# Patient Record
Sex: Male | Born: 1960 | Race: Black or African American | Hispanic: No | Marital: Single | Smoking: Never smoker
Health system: Southern US, Community
[De-identification: ages and names within clinical notes are randomized; demographics above are authoritative.]

## PROBLEM LIST (undated history)

## (undated) DIAGNOSIS — G8929 Other chronic pain: Secondary | ICD-10-CM

## (undated) HISTORY — PX: HERNIA REPAIR: SHX51

---

## 2008-11-03 DIAGNOSIS — Z86010 Personal history of colonic polyps: Secondary | ICD-10-CM | POA: Insufficient documentation

## 2018-11-09 ENCOUNTER — Encounter: Payer: Self-pay | Admitting: Emergency Medicine

## 2018-11-09 ENCOUNTER — Other Ambulatory Visit: Payer: Self-pay

## 2018-11-09 ENCOUNTER — Emergency Department
Admission: EM | Admit: 2018-11-09 | Discharge: 2018-11-09 | Disposition: A | Discharge status: Home / Self Care | Payer: Medicare Other | Attending: Emergency Medicine | Admitting: Emergency Medicine

## 2018-11-09 DIAGNOSIS — M436 Torticollis: Secondary | ICD-10-CM | POA: Diagnosis not present

## 2018-11-09 DIAGNOSIS — M542 Cervicalgia: Secondary | ICD-10-CM | POA: Diagnosis present

## 2018-11-09 MED ORDER — METHOCARBAMOL 500 MG PO TABS: 500.0000 mg | ORAL_TABLET | Freq: Four times a day (QID) | ORAL | 0 refills | Status: DC | PRN

## 2018-11-09 MED ORDER — KETOROLAC TROMETHAMINE 30 MG/ML IJ SOLN
30.0000 mg | Freq: Once | INTRAMUSCULAR | Status: AC
  Administered 2018-11-09: 30 mg via INTRAMUSCULAR
  Filled 2018-11-09: qty 1

## 2018-11-09 MED ORDER — KETOROLAC TROMETHAMINE 10 MG PO TABS: 10.0000 mg | ORAL_TABLET | Freq: Four times a day (QID) | ORAL | 0 refills | Status: DC | PRN

## 2018-11-09 NOTE — ED Notes (Signed)
Pt c/o neck pain xfew days, states " krik in my neck". NAD noted

## 2018-11-09 NOTE — ED Provider Notes (Signed)
[facility] Emergency Department Provider Note  ____________________________________________   First MD Initiated Contact with Patient 11/09/18 1212     (approximate)  I have reviewed the triage vital signs and the nursing notes.   HISTORY  Chief Complaint Torticollis   HPI Rose PhiShelton Wadhwa is a 58 y.o. male presents to the ED with complaint of cervical pain and upper back pain after he woke up approximately 1 week ago.  Patient states that he is occasionally taken over-the-counter medication without any relief.  He denies any injury to his neck.  He denies any paresthesias to his upper or lower extremities.  Patient continues to ambulate without any assistance.   History reviewed. No pertinent past medical history.  There are no active problems to display for this patient.   History reviewed. No pertinent surgical history.  Prior to Admission medications   Medication Sig Start Date End Date Taking? Authorizing Provider  ketorolac (TORADOL) 10 MG tablet Take 1 tablet (10 mg total) by mouth every 6 (six) hours as needed for moderate pain. 11/09/18   Tommi RumpsSummers, Gleen Ripberger L, PA-C  methocarbamol (ROBAXIN) 500 MG tablet Take 1 tablet (500 mg total) by mouth every 6 (six) hours as needed. 11/09/18   Tommi RumpsSummers, Krista Godsil L, PA-C    Allergies Patient has no known allergies.  No family history on file.  Social History Social History   Tobacco Use  . Smoking status: Never Smoker  . Smokeless tobacco: Never Used  Substance Use Topics  . Alcohol use: Not on file  . Drug use: Not on file    Review of Systems Constitutional: No fever/chills Eyes: No visual changes. ENT: No sore throat. Cardiovascular: Denies chest pain. Respiratory: Denies shortness of breath. Gastrointestinal: No abdominal pain.  No nausea, no vomiting.  Musculoskeletal: Positive for cervical pain and upper back pain. Skin: Negative for rash. Neurological: Negative for headaches, focal weakness  or numbness. ___________________________________________   PHYSICAL EXAM:  VITAL SIGNS: ED Triage Vitals [11/09/18 1153]  Enc Vitals Group     BP 127/77     Pulse Rate 65     Resp 16     Temp 97.7 F (36.5 C)     Temp Source Oral     SpO2 98 %     Weight      Height      Head Circumference      Peak Flow      Pain Score      Pain Loc      Pain Edu?      Excl. in GC?    Constitutional: Alert and oriented. Well appearing and in no acute distress. Eyes: Conjunctivae are normal.  Head: Atraumatic. Nose: No congestion/rhinnorhea. Mouth/Throat: Mucous membranes are moist.  Oropharynx non-erythematous. Neck: No stridor.  No tenderness on palpation of cervical spine posteriorly.  Range of motion is decreased with increased pain with lateral movement and extension.  There is tenderness on palpation of the trapezius muscles as well.  No soft tissue edema or discoloration to suggest injury. Cardiovascular: Normal rate, regular rhythm. Grossly normal heart sounds.  Good peripheral circulation. Respiratory: Normal respiratory effort.  No retractions. Lungs CTAB. Gastrointestinal: Soft and nontender. No distention. Musculoskeletal: Moves upper and lower extremities without any difficulty.  Normal gait was noted. Neurologic:  Normal speech and language. No gross focal neurologic deficits are appreciated. No gait instability. Skin:  Skin is warm, dry and intact. No rash noted. Psychiatric: Mood and affect are normal. Speech and behavior  are normal.  ____________________________________________   LABS (all labs ordered are listed, but only abnormal results are displayed)  Labs Reviewed - No data to display  PROCEDURES  Procedure(s) performed: None  Procedures  Critical Care performed: No  ____________________________________________   INITIAL IMPRESSION / ASSESSMENT AND PLAN / ED COURSE  As part of my medical decision making, I reviewed the following data within the  electronic MEDICAL RECORD NUMBER Notes from prior ED visits and [place] Controlled Substance Database  Patient presents to the ED with complaint of cervical pain after waking up approximately 1 week ago.  Patient states that there is been no injury to his neck.  He denies any paresthesias to his upper extremities.  He is tried over-the-counter medications without any improvement.  He denies any fever or chills.  Physical exam is consistent with a torticollis.  Patient was given tramadol 30 mg IM while in the department however he is driving and muscle relaxants were not used.  He was discharged with a prescription for methocarbamol 500 mg every 6 hours as needed for muscle spasms and to continue with Toradol 10 mg every 6 hours with food.  He is encouraged to use ice or heat to his neck as needed for discomfort.  ____________________________________________   FINAL CLINICAL IMPRESSION(S) / ED DIAGNOSES  Final diagnoses:  Torticollis, acute     ED Discharge Orders         Ordered    methocarbamol (ROBAXIN) 500 MG tablet  Every 6 hours PRN     11/09/18 1327    ketorolac (TORADOL) 10 MG tablet  Every 6 hours PRN     11/09/18 1327           Note:  This document was prepared using Dragon voice recognition software and may include unintentional dictation errors.    Tommi Rumps, PA-C 11/09/18 1416    Rockne Menghini, MD 11/10/18 1537

## 2018-11-09 NOTE — Discharge Instructions (Signed)
Follow-up with your primary care provider or can no clinic acute care if any continued problems.  Begin taking the medications only as directed.  Robaxin is 1 tablet every 6 hours as needed for muscle spasms.  This medication could cause drowsiness and do not take this and drive.  Toradol 10 mg every 6 hours with food for inflammation and pain.  You may USE ice or heat to your neck and back as needed for discomfort.

## 2018-11-09 NOTE — ED Triage Notes (Signed)
Pt c/o neck and back pain

## 2018-11-09 NOTE — ED Notes (Signed)
See triage note Presents with stiffness to left aside of neck for couple of days  States he woke up with sx's  Denies any injury

## 2019-02-16 ENCOUNTER — Other Ambulatory Visit: Payer: Self-pay

## 2019-02-16 ENCOUNTER — Emergency Department
Admission: EM | Admit: 2019-02-16 | Discharge: 2019-02-16 | Disposition: A | Discharge status: Home / Self Care | Payer: Medicare Other | Attending: Emergency Medicine | Admitting: Emergency Medicine

## 2019-02-16 ENCOUNTER — Encounter: Payer: Self-pay | Admitting: Emergency Medicine

## 2019-02-16 ENCOUNTER — Emergency Department: Payer: Medicare Other

## 2019-02-16 DIAGNOSIS — R059 Cough, unspecified: Secondary | ICD-10-CM

## 2019-02-16 DIAGNOSIS — R05 Cough: Secondary | ICD-10-CM | POA: Diagnosis not present

## 2019-02-16 LAB — CBC WITH DIFFERENTIAL/PLATELET
Abs Immature Granulocytes: 0.02 10*3/uL (ref 0.00–0.07)
Basophils Absolute: 0 10*3/uL (ref 0.0–0.1)
Basophils Relative: 1 %
Eosinophils Absolute: 0 10*3/uL (ref 0.0–0.5)
Eosinophils Relative: 0 %
HCT: 40.5 % (ref 39.0–52.0)
Hemoglobin: 14.4 g/dL (ref 13.0–17.0)
Immature Granulocytes: 0 %
Lymphocytes Relative: 16 %
Lymphs Abs: 1.3 10*3/uL (ref 0.7–4.0)
MCH: 32.3 pg (ref 26.0–34.0)
MCHC: 35.6 g/dL (ref 30.0–36.0)
MCV: 90.8 fL (ref 80.0–100.0)
Monocytes Absolute: 0.5 10*3/uL (ref 0.1–1.0)
Monocytes Relative: 6 %
Neutro Abs: 6 10*3/uL (ref 1.7–7.7)
Neutrophils Relative %: 77 %
Platelets: 254 10*3/uL (ref 150–400)
RBC: 4.46 MIL/uL (ref 4.22–5.81)
RDW: 12.1 % (ref 11.5–15.5)
Smear Review: NORMAL
WBC: 7.8 10*3/uL (ref 4.0–10.5)
nRBC: 0 % (ref 0.0–0.2)

## 2019-02-16 LAB — BASIC METABOLIC PANEL
Anion gap: 9 (ref 5–15)
BUN: 12 mg/dL (ref 6–20)
CO2: 27 mmol/L (ref 22–32)
Calcium: 10 mg/dL (ref 8.9–10.3)
Chloride: 104 mmol/L (ref 98–111)
Creatinine, Ser: 1.12 mg/dL (ref 0.61–1.24)
GFR calc Af Amer: >60 mL/min (ref >60)
GFR calc non Af Amer: >60 mL/min (ref >60)
Glucose, Bld: 94 mg/dL (ref 70–99)
Potassium: 4.4 mmol/L (ref 3.5–5.1)
Sodium: 140 mmol/L (ref 135–145)

## 2019-02-16 LAB — BRAIN NATRIURETIC PEPTIDE: B Natriuretic Peptide: 13 pg/mL (ref 0.0–100.0)

## 2019-02-16 LAB — TROPONIN I: Troponin I: <0.03 ng/mL (ref <0.03)

## 2019-02-16 MED ORDER — BENZONATATE 200 MG PO CAPS: 200.0000 mg | ORAL_CAPSULE | Freq: Three times a day (TID) | ORAL | 0 refills | Status: DC | PRN

## 2019-02-16 NOTE — ED Provider Notes (Signed)
[facility] Emergency Department Provider Note       Time seen: ----------------------------------------- 12:45 PM on 02/16/2019 -----------------------------------------   I have reviewed the triage vital signs and the nursing notes.  HISTORY   Chief Complaint Cough    HPI Ryan Erickson is a 58 y.o. male with no known past medical history who presents to the ED for persistent dry cough for the past 3 weeks.  Patient denies any associated symptoms and arrives afebrile.  Patient denies fevers, chills, chest pain, shortness of breath, vomiting or diarrhea.  He denies any sick contacts.  History reviewed. No pertinent past medical history.  There are no active problems to display for this patient.   History reviewed. No pertinent surgical history.  Allergies Patient has no known allergies.  Social History Social History   Tobacco Use  . Smoking status: Never Smoker  . Smokeless tobacco: Never Used  Substance Use Topics  . Alcohol use: Never    Frequency: Never  . Drug use: Yes    Types: Marijuana   Review of Systems Constitutional: Negative for fever. Cardiovascular: Negative for chest pain. Respiratory: Positive for cough Gastrointestinal: Negative for abdominal pain, vomiting and diarrhea. Musculoskeletal: Negative for back pain. Skin: Negative for rash. Neurological: Negative for headaches, focal weakness or numbness.  All systems negative/normal/unremarkable except as stated in the HPI  ____________________________________________   PHYSICAL EXAM:  VITAL SIGNS: ED Triage Vitals [02/16/19 1200]  Enc Vitals Group     BP (!) 152/99     Pulse Rate (!) 117     Resp 16     Temp 98.4 F (36.9 C)     Temp Source Oral     SpO2 96 %     Weight 137 lb (62.1 kg)     Height 5\' 5"  (1.651 m)     Head Circumference      Peak Flow      Pain Score 0     Pain Loc      Pain Edu?      Excl. in GC?    Constitutional: Alert and oriented.  Well appearing and in no distress. Eyes: Conjunctivae are normal. Normal extraocular movements. ENT      Head: Normocephalic and atraumatic.      Nose: No congestion/rhinnorhea.      Mouth/Throat: Mucous membranes are moist.      Neck: No stridor. Cardiovascular: Normal rate, regular rhythm. No murmurs, rubs, or gallops. Respiratory: Normal respiratory effort without tachypnea nor retractions. Breath sounds are clear and equal bilaterally. No wheezes/rales/rhonchi. Gastrointestinal: Soft and nontender. Normal bowel sounds Musculoskeletal: Nontender with normal range of motion in extremities. No lower extremity tenderness nor edema. Neurologic:  Normal speech and language. No gross focal neurologic deficits are appreciated.  Skin:  Skin is warm, dry and intact. No rash noted. Psychiatric: Mood and affect are normal. Speech and behavior are normal.  ___________________________________________  ED COURSE:  As part of my medical decision making, I reviewed the following data within the electronic MEDICAL RECORD NUMBER History obtained from family if available, nursing notes, old chart and ekg, as well as notes from prior ED visits. Patient presented for cough, we will assess with labs and imaging as indicated at this time.   Procedures  Ryan PhiShelton Legrand was evaluated in Emergency Department on 02/16/2019 for the symptoms described in the history of present illness. He was evaluated in the context of the global COVID-19 pandemic, which necessitated consideration that the patient might be at  risk for infection with the SARS-CoV-2 virus that causes COVID-19. Institutional protocols and algorithms that pertain to the evaluation of patients at risk for COVID-19 are in a state of rapid change based on information released by regulatory bodies including the CDC and federal and state organizations. These policies and algorithms were followed during the patient's care in the ED.   EKG: Sinus rhythm the rate of 84  bpm, normal PR interval, normal QRS, normal QT ____________________________________________   LABS (pertinent positives/negatives)  Labs Reviewed  CBC WITH DIFFERENTIAL/PLATELET  BASIC METABOLIC PANEL  BRAIN NATRIURETIC PEPTIDE  TROPONIN I    RADIOLOGY Images were viewed by me  Chest x-ray IMPRESSION: No active disease.  ____________________________________________   DIFFERENTIAL DIAGNOSIS   Cough, viral syndrome, pneumonia, GERD, medication side effect, anxiety  FINAL ASSESSMENT AND PLAN  Cough   Plan: The patient had presented for persistent cough. Patient's labs were reassuring. Patient's imaging is unremarkable.  No clear etiology for his cough.  I will prescribe Tessalon to take as needed.   Ulice Dash, MD    Note: This note was generated in part or whole with voice recognition software. Voice recognition is usually quite accurate but there are transcription errors that can and very often do occur. I apologize for any typographical errors that were not detected and corrected.     Emily Filbert, MD 02/16/19 [phone]

## 2019-02-16 NOTE — ED Triage Notes (Signed)
Pt in via POV, reports dry cough x 3 weeks, denies any associated symptoms.  Pt afebrile.  NAD noted at this time.

## 2020-03-26 DIAGNOSIS — E559 Vitamin D deficiency, unspecified: Secondary | ICD-10-CM | POA: Insufficient documentation

## 2020-07-28 ENCOUNTER — Encounter: Payer: Self-pay | Admitting: Emergency Medicine

## 2020-07-28 ENCOUNTER — Other Ambulatory Visit: Payer: Self-pay

## 2020-07-28 ENCOUNTER — Emergency Department
Admission: EM | Admit: 2020-07-28 | Discharge: 2020-07-28 | Disposition: A | Discharge status: Home / Self Care | Payer: Medicare Other | Attending: Emergency Medicine | Admitting: Emergency Medicine

## 2020-07-28 ENCOUNTER — Emergency Department: Payer: Medicare Other

## 2020-07-28 DIAGNOSIS — R42 Dizziness and giddiness: Secondary | ICD-10-CM | POA: Diagnosis present

## 2020-07-28 DIAGNOSIS — R079 Chest pain, unspecified: Secondary | ICD-10-CM | POA: Insufficient documentation

## 2020-07-28 LAB — DIFFERENTIAL
Abs Immature Granulocytes: 0.04 K/uL (ref 0.00–0.07)
Basophils Absolute: 0 K/uL (ref 0.0–0.1)
Basophils Relative: 0 %
Eosinophils Absolute: 0.1 K/uL (ref 0.0–0.5)
Eosinophils Relative: 1 %
Immature Granulocytes: 0 %
Lymphocytes Relative: 27 %
Lymphs Abs: 2.5 K/uL (ref 0.7–4.0)
Monocytes Absolute: 0.5 K/uL (ref 0.1–1.0)
Monocytes Relative: 6 %
Neutro Abs: 6.1 K/uL (ref 1.7–7.7)
Neutrophils Relative %: 66 %

## 2020-07-28 LAB — PROTIME-INR
INR: 0.9 (ref 0.8–1.2)
Prothrombin Time: 12.1 seconds (ref 11.4–15.2)

## 2020-07-28 LAB — APTT: aPTT: 27 seconds (ref 24–36)

## 2020-07-28 LAB — COMPREHENSIVE METABOLIC PANEL WITH GFR
ALT: 42 U/L (ref 0–44)
AST: 37 U/L (ref 15–41)
Albumin: 4.5 g/dL (ref 3.5–5.0)
Alkaline Phosphatase: 59 U/L (ref 38–126)
Anion gap: 10 (ref 5–15)
BUN: 15 mg/dL (ref 8–23)
CO2: 24 mmol/L (ref 22–32)
Calcium: 9.6 mg/dL (ref 8.9–10.3)
Chloride: 107 mmol/L (ref 98–111)
Creatinine, Ser: 1.21 mg/dL (ref 0.61–1.24)
GFR calc Af Amer: >60 mL/min
GFR calc non Af Amer: >60 mL/min
Glucose, Bld: 169 mg/dL — ABNORMAL HIGH (ref 70–99)
Potassium: 3.6 mmol/L (ref 3.5–5.1)
Sodium: 141 mmol/L (ref 135–145)
Total Bilirubin: 1.1 mg/dL (ref 0.3–1.2)
Total Protein: 7.9 g/dL (ref 6.5–8.1)

## 2020-07-28 LAB — GLUCOSE, CAPILLARY: Glucose-Capillary: 169 mg/dL — ABNORMAL HIGH (ref 70–99)

## 2020-07-28 LAB — CBC
HCT: 37.6 % — ABNORMAL LOW (ref 39.0–52.0)
Hemoglobin: 13.3 g/dL (ref 13.0–17.0)
MCH: 33.1 pg (ref 26.0–34.0)
MCHC: 35.4 g/dL (ref 30.0–36.0)
MCV: 93.5 fL (ref 80.0–100.0)
Platelets: 277 10*3/uL (ref 150–400)
RBC: 4.02 MIL/uL — ABNORMAL LOW (ref 4.22–5.81)
RDW: 12.4 % (ref 11.5–15.5)
WBC: 9.3 10*3/uL (ref 4.0–10.5)
nRBC: 0 % (ref 0.0–0.2)

## 2020-07-28 LAB — TROPONIN I (HIGH SENSITIVITY): Troponin I (High Sensitivity): 6 ng/L (ref <18)

## 2020-07-28 MED ORDER — ASPIRIN EC 81 MG PO TBEC: 81.0000 mg | DELAYED_RELEASE_TABLET | Freq: Every day | ORAL | 2 refills | Status: AC

## 2020-07-28 MED ORDER — MECLIZINE HCL 12.5 MG PO TABS: 12.5000 mg | ORAL_TABLET | Freq: Three times a day (TID) | ORAL | 0 refills | Status: DC | PRN

## 2020-07-28 MED ORDER — ASPIRIN 81 MG PO CHEW
324.0000 mg | CHEWABLE_TABLET | Freq: Once | ORAL | Status: AC
  Administered 2020-07-28: 324 mg via ORAL
  Filled 2020-07-28: qty 4

## 2020-07-28 NOTE — ED Triage Notes (Addendum)
First RN note: pt presents to ED via [facility] with c/o nausea, dizziness, and 1 episode of emesis this morning PTA.    160/84 71HR 100% RA

## 2020-07-28 NOTE — ED Notes (Signed)
Pt reports L-sided pressure for about a month now. States it is a 2/10 currently.

## 2020-07-28 NOTE — ED Notes (Signed)
Called lab about adding on troponin. [name] states they're running it now. Pt denies history of taking BP meds or known HTN. Denies history of HA's. Pt given another warm blanket.

## 2020-07-28 NOTE — ED Notes (Signed)
Pt given food tray and water by Joni Reining NT with verbal okay from EDP [place].

## 2020-07-28 NOTE — ED Triage Notes (Signed)
Pt to ED via [facility] c/o dizziness and vomiting. Pt states that this started when he woke up. Pt feels like the room is spinning. Pt denies hx/o vertigo. Pt is in NAD.

## 2020-07-28 NOTE — ED Notes (Signed)
Pt on the phone updating his family/friends.

## 2020-07-28 NOTE — ED Provider Notes (Signed)
[facility] Emergency Department Provider Note   ____________________________________________   First MD Initiated Contact with Patient 07/28/20 1507     (approximate)  I have reviewed the triage vital signs and the nursing notes.   HISTORY  Chief Complaint Dizziness    HPI Ryan Erickson is a 59 y.o. male with no significant past medical history who presents to the ED complaining of dizziness.  Patient reports that when he woke up this morning he felt like the room was spinning around him and symptoms got worse when he went to stand up.  He initially had some nausea while feeling dizzy, but did not vomit.  He denies any associated vision changes, speech changes, numbness, or weakness.  He was feeling fine when he went to bed last night and denies any recent fevers, cough, or shortness of breath.  He does state he has been having intermittent sharp pain in his left upper chest for about the past month.  This pain is worse when he lays in certain positions, but he currently denies any chest pain.  He states that his dizziness is also significantly improved and he is no longer feeling nauseous.  He denies any history of vertigo or similar symptoms.        History reviewed. No pertinent past medical history.  There are no problems to display for this patient.   History reviewed. No pertinent surgical history.  Prior to Admission medications   Medication Sig Start Date End Date Taking? Authorizing Provider  aspirin EC 81 MG tablet Take 1 tablet (81 mg total) by mouth daily. Swallow whole. 07/28/20 10/21/21  Chesley Noon, MD  meclizine (ANTIVERT) 12.5 MG tablet Take 1 tablet (12.5 mg total) by mouth 3 (three) times daily as needed for dizziness. 07/28/20   Chesley Noon, MD    Allergies Patient has no known allergies.  No family history on file.  Social History Social History   Tobacco Use  . Smoking status: Never Smoker  . Smokeless tobacco:  Never Used  Vaping Use  . Vaping Use: Never used  Substance Use Topics  . Alcohol use: Never  . Drug use: Yes    Types: Marijuana    Review of Systems  Constitutional: No fever/chills. Eyes: No visual changes. ENT: No sore throat. Cardiovascular: Positive for chest pain. Respiratory: Denies shortness of breath. Gastrointestinal: No abdominal pain.  No nausea, no vomiting.  No diarrhea.  No constipation. Genitourinary: Negative for dysuria. Musculoskeletal: Negative for back pain. Skin: Negative for rash. Neurological: Negative for headaches, focal weakness or numbness.  Positive for dizziness.  ____________________________________________   PHYSICAL EXAM:  VITAL SIGNS: ED Triage Vitals  Enc Vitals Group     BP 07/28/20 0903 (!) 177/88     Pulse Rate 07/28/20 0903 61     Resp 07/28/20 0903 12     Temp --      Temp Source 07/28/20 0903 Oral     SpO2 07/28/20 0903 100 %     Weight --      Height --      Head Circumference --      Peak Flow --      Pain Score 07/28/20 0904 0     Pain Loc --      Pain Edu? --      Excl. in GC? --     Constitutional: Alert and oriented. Eyes: Conjunctivae are normal.  Pupils equal round and reactive to light bilaterally, extraocular movements intact. Head:  Atraumatic. Nose: No congestion/rhinnorhea. Mouth/Throat: Mucous membranes are moist. Neck: Normal ROM Cardiovascular: Normal rate, regular rhythm. Grossly normal heart sounds. Respiratory: Normal respiratory effort.  No retractions. Lungs CTAB. Gastrointestinal: Soft and nontender. No distention. Genitourinary: deferred Musculoskeletal: No lower extremity tenderness nor edema. Neurologic:  Normal speech and language. No gross focal neurologic deficits are appreciated.  Ambulates with steady gait. Skin:  Skin is warm, dry and intact. No rash noted. Psychiatric: Mood and affect are normal. Speech and behavior are normal.  ____________________________________________    LABS (all labs ordered are listed, but only abnormal results are displayed)  Labs Reviewed  CBC - Abnormal; Notable for the following components:      Result Value   RBC 4.02 (*)    HCT 37.6 (*)    All other components within normal limits  COMPREHENSIVE METABOLIC PANEL - Abnormal; Notable for the following components:   Glucose, Bld 169 (*)    All other components within normal limits  GLUCOSE, CAPILLARY - Abnormal; Notable for the following components:   Glucose-Capillary 169 (*)    All other components within normal limits  PROTIME-INR  APTT  DIFFERENTIAL  CBG MONITORING, ED  TROPONIN I (HIGH SENSITIVITY)   ____________________________________________  EKG  ED ECG REPORT I, Chesley Noon, the attending physician, personally viewed and interpreted this ECG.   Date: 07/28/2020  EKG Time: 9:16  Rate: 65  Rhythm: normal sinus rhythm  Axis: Normal  Intervals:none  ST&T Change: None   PROCEDURES  Procedure(s) performed (including Critical Care):  Procedures   ____________________________________________   INITIAL IMPRESSION / ASSESSMENT AND PLAN / ED COURSE       59 year old male with no significant past medical history presents to the ED with acute onset dizziness and feeling like the room is spinning around him after getting up this morning.  He also reports intermittent sharp pain in his left upper chest over the past month.  Chest pain is likely musculoskeletal in origin given it is worse with certain positions and has been intermittent for greater than 1 month.  EKG shows no acute ischemic changes, will screen troponin but low suspicion for ACS.  Remainder of lab work is reassuring.  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  Plan to discuss with neurology and potentially obtain MRI here in the ED.  Patient now states that  his symptoms are improving and he is able to ambulate with a steady gait, so I have a low suspicion for acute stroke.  CT findings discussed with neurology, who agrees that there are likely chronic and unrelated to his symptoms today given their present in the frontal lobes.  Patient symptoms have now resolved and I do not suspect acute stroke, although given his CT findings he would benefit from being on daily baby aspirin.  Will prescribe meclizine for likely peripheral vertigo and patient referred to neurology for follow-up.  He was counseled to return to the ED for any new or worsening symptoms, patient agrees with plan.      ____________________________________________   FINAL CLINICAL IMPRESSION(S) / ED DIAGNOSES  Final diagnoses:  Dizziness  Vertigo     ED Discharge Orders         Ordered    aspirin EC 81 MG tablet  Daily        07/28/20 1731    meclizine (ANTIVERT) 12.5 MG tablet  3 times daily PRN  07/28/20 1731           Note:  This document was prepared using Dragon voice recognition software and may include unintentional dictation errors.   Chesley Noon, MD 07/28/20 1739

## 2021-03-05 ENCOUNTER — Emergency Department
Admission: EM | Admit: 2021-03-05 | Discharge: 2021-03-05 | Disposition: A | Discharge status: Home / Self Care | Payer: Medicare Other | Attending: Emergency Medicine | Admitting: Emergency Medicine

## 2021-03-05 ENCOUNTER — Other Ambulatory Visit: Payer: Self-pay

## 2021-03-05 DIAGNOSIS — Z7982 Long term (current) use of aspirin: Secondary | ICD-10-CM | POA: Diagnosis not present

## 2021-03-05 DIAGNOSIS — M25512 Pain in left shoulder: Secondary | ICD-10-CM | POA: Diagnosis not present

## 2021-03-05 DIAGNOSIS — M542 Cervicalgia: Secondary | ICD-10-CM | POA: Diagnosis not present

## 2021-03-05 HISTORY — DX: Other chronic pain: G89.29

## 2021-03-05 MED ORDER — CYCLOBENZAPRINE HCL 7.5 MG PO TABS: 7.5000 mg | ORAL_TABLET | Freq: Three times a day (TID) | ORAL | 0 refills | Status: DC | PRN

## 2021-03-05 MED ORDER — PREDNISONE 50 MG PO TABS: ORAL_TABLET | ORAL | 0 refills | Status: DC

## 2021-03-05 NOTE — ED Provider Notes (Signed)
[facility] Emergency Department Provider Note   ____________________________________________   Event Date/Time   First MD Initiated Contact with Patient 03/05/21 1112     (approximate)  I have reviewed the triage vital signs and the nursing notes.   HISTORY  Chief Complaint Neck Pain    HPI Ryan Erickson is a 60 y.o. male history of chronic back pain, otherwise reports very healthy.  Patient reports for 2 weeks now has been having pain along the left side of his neck.  It started when he woke from sleep 1 day.  No numbness no weakness in an arm or leg.  He is noticing that sometimes the pain will shoot down especially if he turns his head to the left towards the top of his left shoulder and then occasionally feels a little bit of a twinge of discomfort in his left index finger.  No cold fingers, no difficulty using the hand.  No back pain except he reports he is having just a little bit of an achiness under his right armpit for the last 2 days.  No shortness of breath no cough.  No chest pain  Needs took an aspirin tablet to help for with pain, he reports that does help decrease his neck discomfort.  No fevers or chills.   Past Medical History:  Diagnosis Date  . Chronic back pain     There are no problems to display for this patient.   Past Surgical History:  Procedure Laterality Date  . HERNIA REPAIR      Prior to Admission medications   Medication Sig Start Date End Date Taking? Authorizing Provider  cyclobenzaprine (FEXMID) 7.5 MG tablet Take 1 tablet (7.5 mg total) by mouth 3 (three) times daily as needed for muscle spasms. 03/05/21  Yes Sharyn Creamer, MD  predniSONE (DELTASONE) 50 MG tablet 1 tab by mouth daily 03/05/21  Yes Sharyn Creamer, MD  aspirin EC 81 MG tablet Take 1 tablet (81 mg total) by mouth daily. Swallow whole. 07/28/20 10/21/21  Chesley Noon, MD    Allergies Patient has no known allergies.  No family history on file.  Social  History Social History   Tobacco Use  . Smoking status: Never Smoker  . Smokeless tobacco: Never Used  Vaping Use  . Vaping Use: Never used  Substance Use Topics  . Alcohol use: Never  . Drug use: Yes    Types: Marijuana    Review of Systems Constitutional: No fever/chills Eyes: No visual changes. ENT: No sore throat.  Pain along the left side seems to be in the muscles left side of his neck shoots out a stinging pain sometimes specially returns had far the left Cardiovascular: Denies chest pain.  See HPI, reports he has slight discomfort along his right armpit for the last 2 days that seems to come and go when he twists Respiratory: Denies shortness of breath. Musculoskeletal: Negative for back pain. Skin: Negative for rash. Neurological: Negative for headaches, areas of focal weakness or numbness.    ____________________________________________   PHYSICAL EXAM:  VITAL SIGNS: ED Triage Vitals  Enc Vitals Group     BP 03/05/21 1025 125/80     Pulse Rate 03/05/21 1025 79     Resp 03/05/21 1025 18     Temp 03/05/21 1026 97.7 F (36.5 C)     Temp Source 03/05/21 1025 Oral     SpO2 03/05/21 1025 97 %     Weight 03/05/21 1026 140 lb (63.5 kg)  Height 03/05/21 1026 5\' 5"  (1.651 m)     Head Circumference --      Peak Flow --      Pain Score 03/05/21 1026 8     Pain Loc --      Pain Edu? --      Excl. in GC? --     Constitutional: Alert and oriented. Well appearing and in no acute distress. Eyes: Conjunctivae are normal. Head: Atraumatic. Nose: No congestion/rhinnorhea. Mouth/Throat: Mucous membranes are moist. Neck: No stridor.  No meningismus.  No torticollis.  Reports tenderness not along the cervical spine itself, but significant tenderness of the lower left cervical paraspinous muscles.  Palpation of this area he reports reproduces pain and shoots it out towards the top of his left shoulder.  There is no overlying lesion no rash.  Remainder of examination is  normal except pain is induced when he turns his head to the left side.  No induration.  No lesion.  No cervical adenopathy. Cardiovascular: Normal rate, regular rhythm. Grossly normal heart sounds.  Good peripheral circulation. Respiratory: Normal respiratory effort.  No retractions. Lungs CTAB. Gastrointestinal: Soft and nontender. No distention. Musculoskeletal:   Patient has a small area just below his right axilla he reports slight tenderness between rib spaces approximately fourth and fifth rib.  No lesion.  No edema.  No induration.  RIGHT Right upper extremity demonstrates normal strength, good use of all muscles. No edema bruising or contusions of the right shoulder/upper arm, right elbow, right forearm / hand. Full range of motion of the right right upper extremity without pain. No evidence of trauma. Strong radial pulse. Intact median/ulnar/radial neuro-muscular exam.  LEFT Left upper extremity demonstrates normal strength, good use of all muscles. No edema bruising or contusions of the left shoulder/upper arm, left elbow, left forearm / hand. Full range of motion of the left  upper extremity without pain. No evidence of trauma. Strong radial pulse. Intact median/ulnar/radial neuro-muscular exam.   Neurologic:  Normal speech and language. No gross focal neurologic deficits are appreciated.  Skin:  Skin is warm, dry and intact. No rash noted. Psychiatric: Mood and affect are normal. Speech and behavior are normal.  ____________________________________________   LABS (all labs ordered are listed, but only abnormal results are displayed)  Labs Reviewed - No data to display ____________________________________________  EKG  Reviewed interpreted by me at 1135 Heart rate 65 QRS 99 QTc 400 Normal sinus rhythm, no evidence of acute ischemia. ____________________________________________  RADIOLOGY   ____________________________________________   PROCEDURES  Procedure(s)  performed: None  Procedures  Critical Care performed: No  ____________________________________________   INITIAL IMPRESSION / ASSESSMENT AND PLAN / ED COURSE  Pertinent labs & imaging results that were available during my care of the patient were reviewed by me and considered in my medical decision making (see chart for details).   Left cervical pain.  Very reproducible appears to be musculoskeletal I suspect or potentially radicular in nature, especially as it radiates out.  Very reassuring examination.  No neurologic deficits.  I suspect likely radicular etiology.  He has no evidence of infectious cause.  No neurologic deficits.  He is not experience any associated chest pain his EKG does not appear ischemic.  Discussed with the patient, recommendation to follow-up with our spine surgeon, and will trial muscle relaxant and steroid for additional pain relief and anti-inflammatory effect.  Discussed risks and benefits of these medications with the patient, including not to drive or operate heavy machinery while taking  Flexeril.  Patient agreeable with this.  Return precautions and treatment recommendations and follow-up discussed with the patient who is agreeable with the plan.       ____________________________________________   FINAL CLINICAL IMPRESSION(S) / ED DIAGNOSES  Final diagnoses:  Cervicalgia        Note:  This document was prepared using Dragon voice recognition software and may include unintentional dictation errors       Sharyn Creamer, MD 03/05/21 1237

## 2021-03-05 NOTE — ED Notes (Signed)
Presents with neck pain which moves into posterior left shoulder blade which started about 3 weeks ago  Also having pain to right lateral chest intermittently denies any injury or fever

## 2021-03-05 NOTE — ED Triage Notes (Signed)
Pt c/o neck pain from the base of the skull into the shoulder for the past week

## 2021-03-05 NOTE — Discharge Instructions (Signed)
No driving within 8 hours of taking cyclobenzaprine or working with dangerous equipment or in dangerous areas.

## 2021-10-05 IMAGING — CT CT HEAD W/O CM
3 series · 16 of 47 positions shown, 19 images · non-contrast
Comparison: None.

CLINICAL DATA: Dizziness, nausea.

EXAM:
CT HEAD WITHOUT CONTRAST
TECHNIQUE: Contiguous axial images were obtained from the base of the skull
through the vertex without intravenous contrast.

[Series 2: head wo · axial · 0.43mm/px · z∈[+259,+384]mm · 10 of 30 slices shown, 13 images]
[im 3/30  brain]
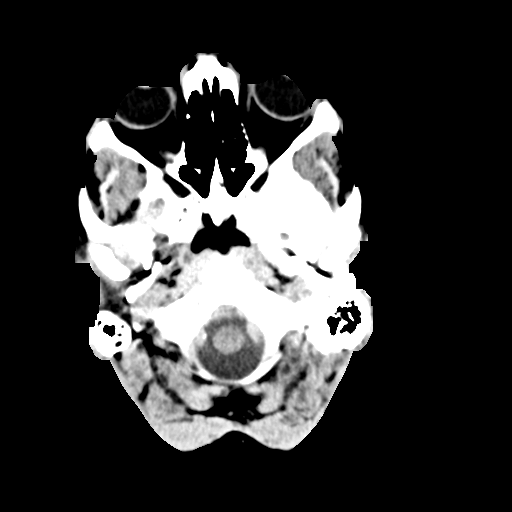
[im 3/30  bone]
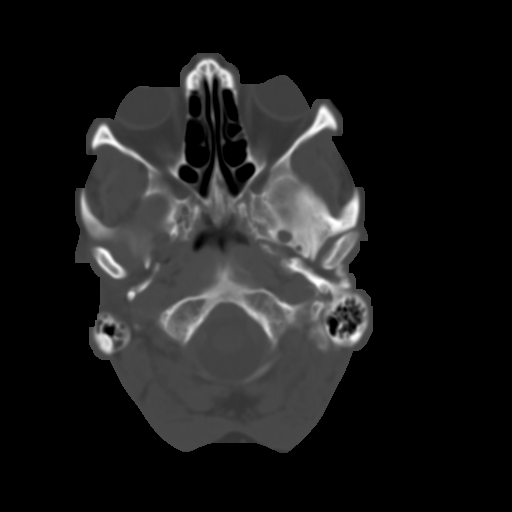
[im 6/30  brain]
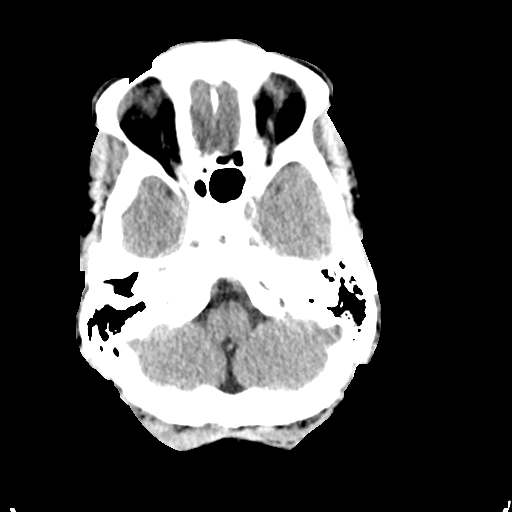
[im 9/30  brain]
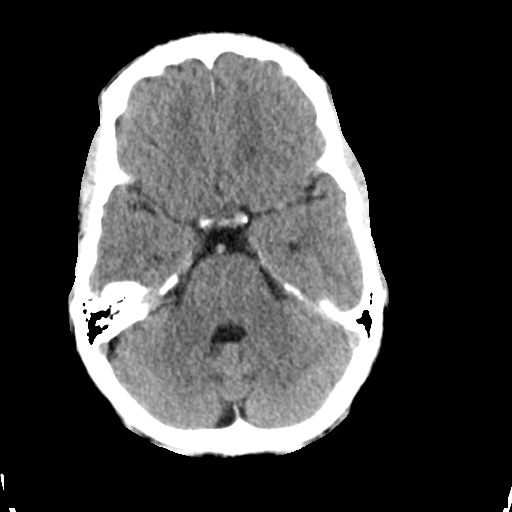
[im 11/30  brain]
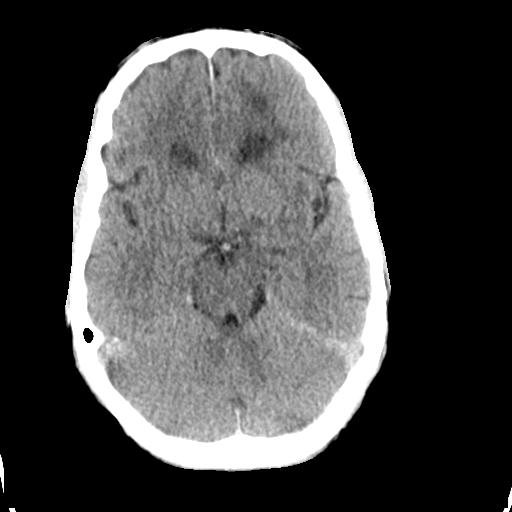
[im 14/30  brain]
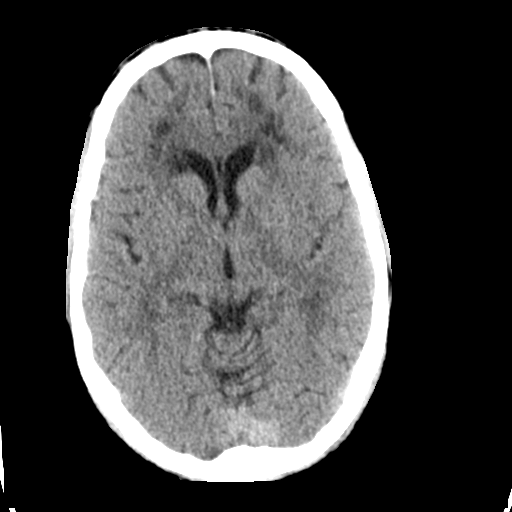
[im 14/30  bone]
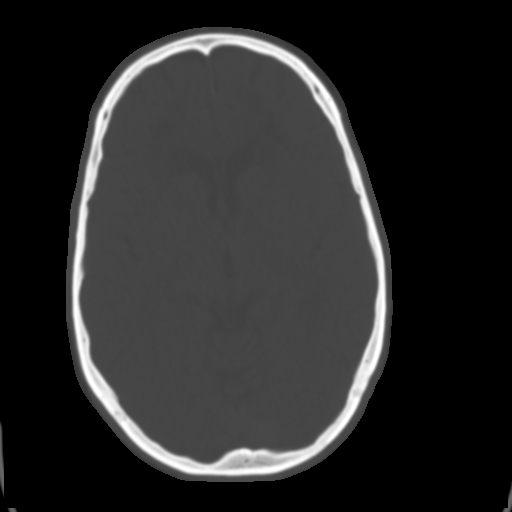
[im 17/30  brain]
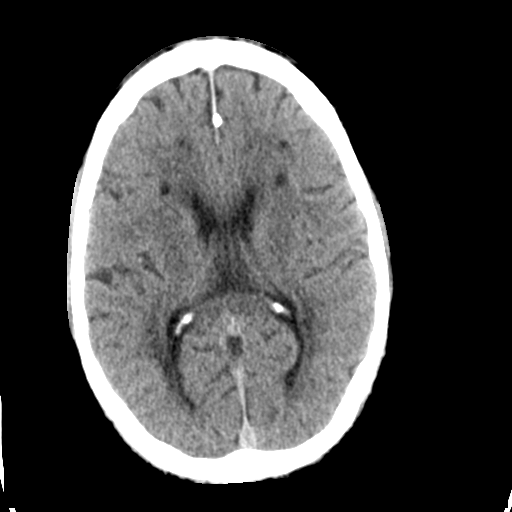
[im 20/30  brain]
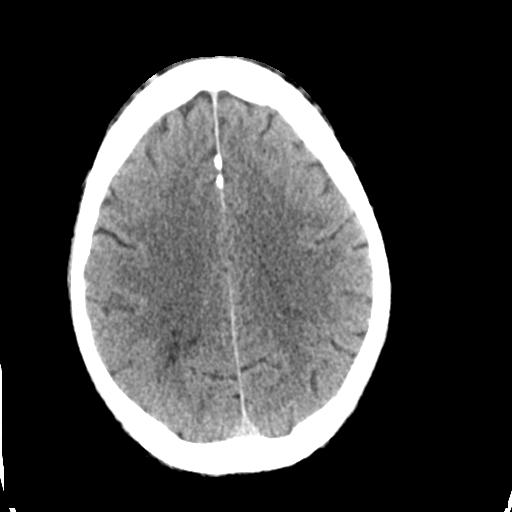
[im 23/30  brain]
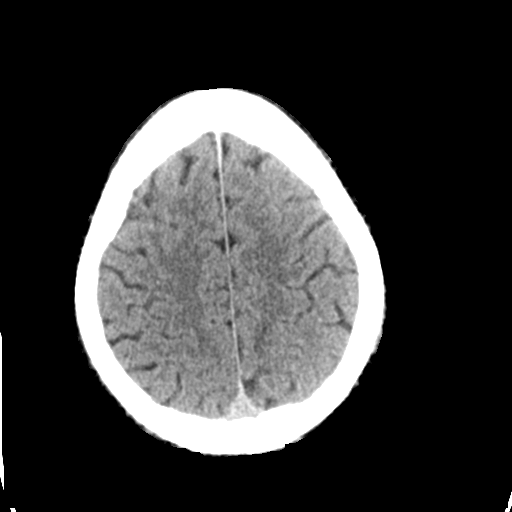
[im 25/30  brain]
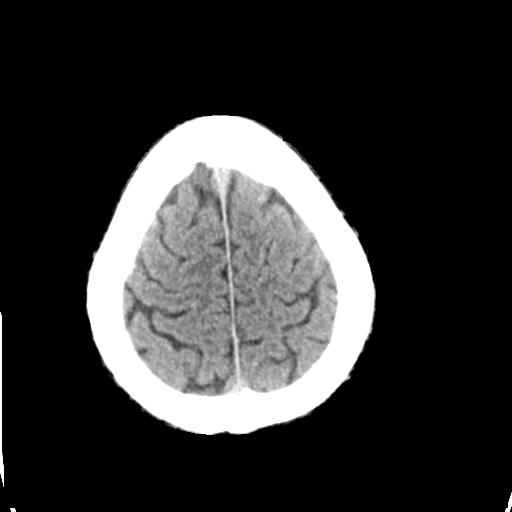
[im 25/30  bone]
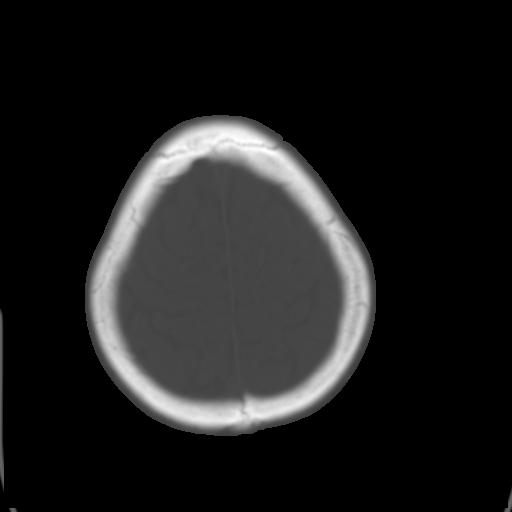
[im 28/30  brain]
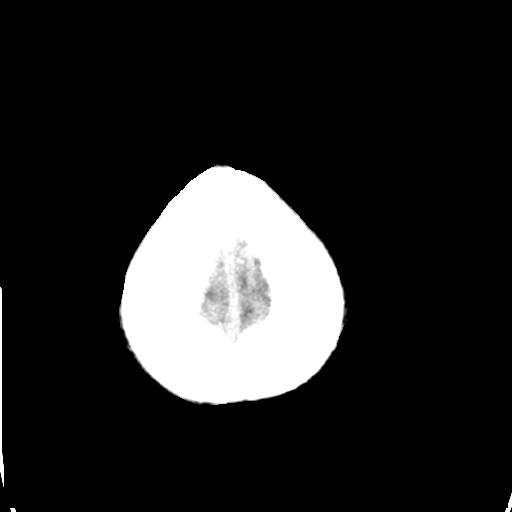

[Series 4: coronal soft tissue · coronal · 0.33mm/px · 3 of 71 slices shown]
[im 24/71  brain]
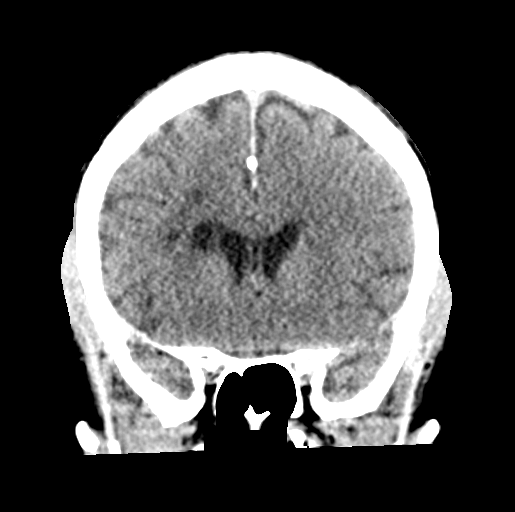
[im 32/71  brain]
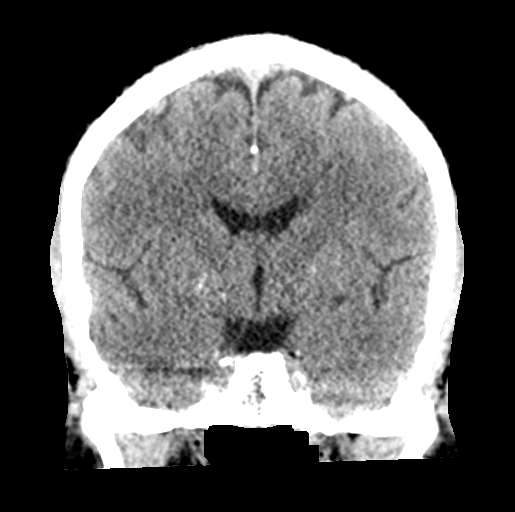
[im 39/71  brain]
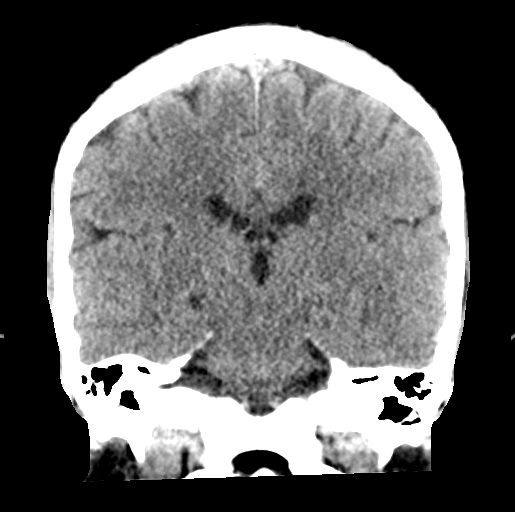

[Series 5: sagittal soft tissue · sagittal · 0.33mm/px · 3 of 56 slices shown]
[im 19/56  brain]
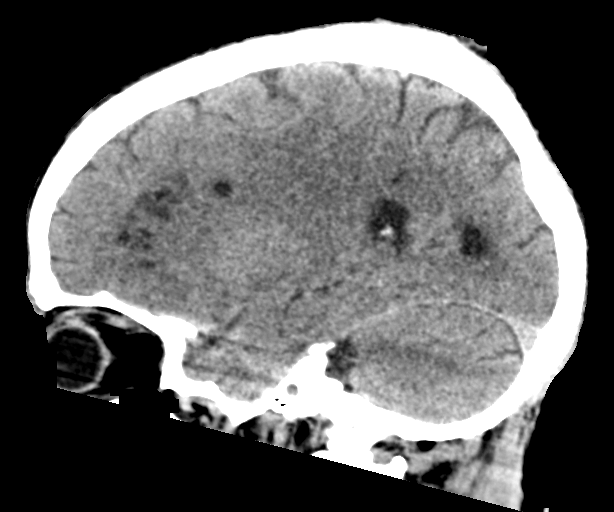
[im 28/56  brain]
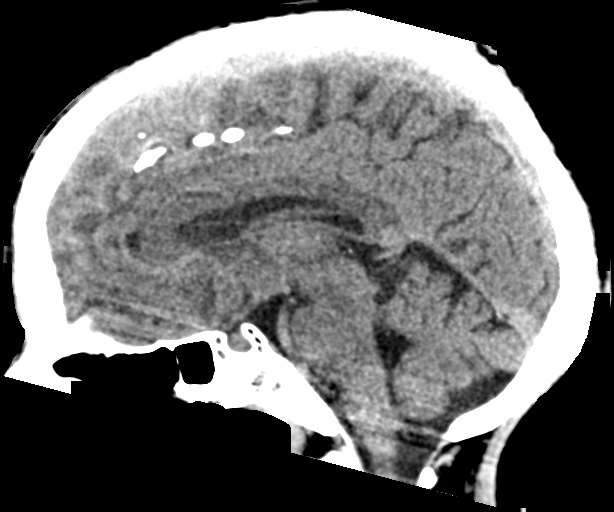
[im 37/56  brain]
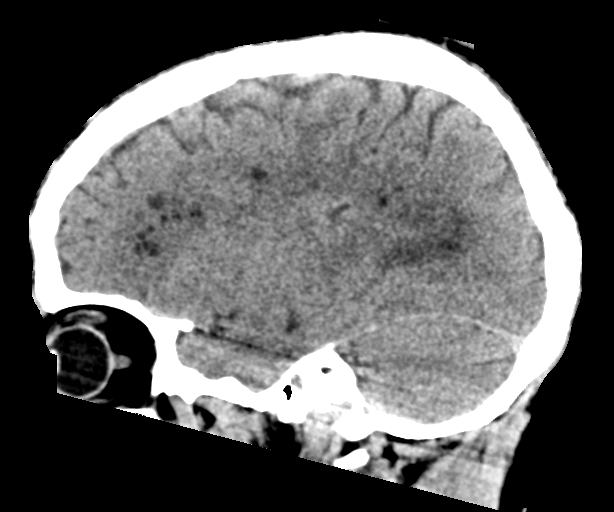

[16 of 47 positions shown; findings below may reference images not displayed]

FINDINGS: Brain: No acute hemorrhage. No hydrocephalus. No abnormal mass
effect. Extensive clustered areas of hypoattenuation involving the
white matter of bilateral anterior frontal lobes and to a lesser
extent the posterior frontal lobes with sparing of the deep gray
nuclei. No abnormal mass effect.

Vascular: No hyperdense vessel or unexpected calcification.

Skull: Normal. Negative for fracture or focal lesion.

Sinuses/Orbits: No acute finding.

Other: No mastoid effusions.
IMPRESSION: Extensive clustered areas of hypoattenuation involving the white
matter bilateral anterior frontal lobes and to a lesser extent the
posterior frontal lobes. Findings are indeterminate by CT, but may
represent prior watershed type infarcts. Additional differential
considerations include demyelination (although the appearence is
atypical), prominent perivascular spaces, or sequela of prior
infectious/inflammatory insult. An MRI with contrast could further
evaluate.

## 2022-08-27 ENCOUNTER — Ambulatory Visit (INDEPENDENT_AMBULATORY_CARE_PROVIDER_SITE_OTHER): Payer: Medicare Other | Admitting: Podiatry

## 2022-08-27 DIAGNOSIS — K122 Cellulitis and abscess of mouth: Secondary | ICD-10-CM | POA: Insufficient documentation

## 2022-08-27 DIAGNOSIS — F4312 Post-traumatic stress disorder, chronic: Secondary | ICD-10-CM | POA: Insufficient documentation

## 2022-08-27 DIAGNOSIS — F1220 Cannabis dependence, uncomplicated: Secondary | ICD-10-CM | POA: Insufficient documentation

## 2022-08-27 DIAGNOSIS — L84 Corns and callosities: Secondary | ICD-10-CM

## 2022-08-27 DIAGNOSIS — F4321 Adjustment disorder with depressed mood: Secondary | ICD-10-CM | POA: Insufficient documentation

## 2022-08-27 DIAGNOSIS — L0291 Cutaneous abscess, unspecified: Secondary | ICD-10-CM | POA: Insufficient documentation

## 2022-08-27 DIAGNOSIS — M2141 Flat foot [pes planus] (acquired), right foot: Secondary | ICD-10-CM | POA: Diagnosis not present

## 2022-08-27 DIAGNOSIS — M722 Plantar fascial fibromatosis: Secondary | ICD-10-CM | POA: Diagnosis not present

## 2022-08-27 DIAGNOSIS — F321 Major depressive disorder, single episode, moderate: Secondary | ICD-10-CM | POA: Insufficient documentation

## 2022-08-27 DIAGNOSIS — R69 Illness, unspecified: Secondary | ICD-10-CM | POA: Insufficient documentation

## 2022-08-27 DIAGNOSIS — N529 Male erectile dysfunction, unspecified: Secondary | ICD-10-CM | POA: Insufficient documentation

## 2022-08-27 DIAGNOSIS — L0390 Cellulitis, unspecified: Secondary | ICD-10-CM | POA: Insufficient documentation

## 2022-08-27 DIAGNOSIS — M2142 Flat foot [pes planus] (acquired), left foot: Secondary | ICD-10-CM

## 2022-08-27 DIAGNOSIS — L732 Hidradenitis suppurativa: Secondary | ICD-10-CM | POA: Insufficient documentation

## 2022-08-27 MED ORDER — MELOXICAM 15 MG PO TABS: 15.0000 mg | ORAL_TABLET | Freq: Every day | ORAL | 3 refills | Status: DC

## 2022-08-27 NOTE — Patient Instructions (Addendum)
Look for urea 40% with 2% salicylic acid cream or ointment and apply to the thickened dry skin / calluses. This can be bought over the counter, at a pharmacy or online such as [employer].    Plantar Fasciitis (Heel Spur Syndrome) with Rehab The plantar fascia is a fibrous, ligament-like, soft-tissue structure that spans the bottom of the foot. Plantar fasciitis is a condition that causes pain in the foot due to inflammation of the tissue. SYMPTOMS  Pain and tenderness on the underneath side of the foot. Pain that worsens with standing or walking. CAUSES  Plantar fasciitis is caused by irritation and injury to the plantar fascia on the underneath side of the foot. Common mechanisms of injury include: Direct trauma to bottom of the foot. Damage to a small nerve that runs under the foot where the main fascia attaches to the heel bone. Stress placed on the plantar fascia due to bone spurs. RISK INCREASES WITH:  Activities that place stress on the plantar fascia (running, jumping, pivoting, or cutting). Poor strength and flexibility. Improperly fitted shoes. Tight calf muscles. Flat feet. Failure to warm-up properly before activity. Obesity. PREVENTION Warm up and stretch properly before activity. Allow for adequate recovery between workouts. Maintain physical fitness: Strength, flexibility, and endurance. Cardiovascular fitness. Maintain a health body weight. Avoid stress on the plantar fascia. Wear properly fitted shoes, including arch supports for individuals who have flat feet.  PROGNOSIS  If treated properly, then the symptoms of plantar fasciitis usually resolve without surgery. However, occasionally surgery is necessary.  RELATED COMPLICATIONS  Recurrent symptoms that may result in a chronic condition. Problems of the lower back that are caused by compensating for the injury, such as limping. Pain or weakness of the foot during push-off following surgery. Chronic inflammation,  scarring, and partial or complete fascia tear, occurring more often from repeated injections.  TREATMENT  Treatment initially involves the use of ice and medication to help reduce pain and inflammation. The use of strengthening and stretching exercises may help reduce pain with activity, especially stretches of the Achilles tendon. These exercises may be performed at home or with a therapist. Your caregiver may recommend that you use heel cups of arch supports to help reduce stress on the plantar fascia. Occasionally, corticosteroid injections are given to reduce inflammation. If symptoms persist for greater than 6 months despite non-surgical (conservative), then surgery may be recommended.   MEDICATION  If pain medication is necessary, then nonsteroidal anti-inflammatory medications, such as aspirin and ibuprofen, or other minor pain relievers, such as acetaminophen, are often recommended. Do not take pain medication within 7 days before surgery. Prescription pain relievers may be given if deemed necessary by your caregiver. Use only as directed and only as much as you need. Corticosteroid injections may be given by your caregiver. These injections should be reserved for the most serious cases, because they may only be given a certain number of times.  HEAT AND COLD Cold treatment (icing) relieves pain and reduces inflammation. Cold treatment should be applied for 10 to 15 minutes every 2 to 3 hours for inflammation and pain and immediately after any activity that aggravates your symptoms. Use ice packs or massage the area with a piece of ice (ice massage). Heat treatment may be used prior to performing the stretching and strengthening activities prescribed by your caregiver, physical therapist, or athletic trainer. Use a heat pack or soak the injury in warm water.  SEEK IMMEDIATE MEDICAL CARE IF: Treatment seems to offer no benefit,  or the condition worsens. Any medications produce adverse side  effects.  EXERCISES- RANGE OF MOTION (ROM) AND STRETCHING EXERCISES - Plantar Fasciitis (Heel Spur Syndrome) These exercises may help you when beginning to rehabilitate your injury. Your symptoms may resolve with or without further involvement from your physician, physical therapist or athletic trainer. While completing these exercises, remember:  Restoring tissue flexibility helps normal motion to return to the joints. This allows healthier, less painful movement and activity. An effective stretch should be held for at least 30 seconds. A stretch should never be painful. You should only feel a gentle lengthening or release in the stretched tissue.  RANGE OF MOTION - Toe Extension, Flexion Sit with your right / left leg crossed over your opposite knee. Grasp your toes and gently pull them back toward the top of your foot. You should feel a stretch on the bottom of your toes and/or foot. Hold this stretch for 10 seconds. Now, gently pull your toes toward the bottom of your foot. You should feel a stretch on the top of your toes and or foot. Hold this stretch for 10 seconds. Repeat  times. Complete this stretch 3 times per day.   RANGE OF MOTION - Ankle Dorsiflexion, Active Assisted Remove shoes and sit on a chair that is preferably not on a carpeted surface. Place right / left foot under knee. Extend your opposite leg for support. Keeping your heel down, slide your right / left foot back toward the chair until you feel a stretch at your ankle or calf. If you do not feel a stretch, slide your bottom forward to the edge of the chair, while still keeping your heel down. Hold this stretch for 10 seconds. Repeat 3 times. Complete this stretch 2 times per day.   STRETCH  Gastroc, Standing Place hands on wall. Extend right / left leg, keeping the front knee somewhat bent. Slightly point your toes inward on your back foot. Keeping your right / left heel on the floor and your knee straight, shift  your weight toward the wall, not allowing your back to arch. You should feel a gentle stretch in the right / left calf. Hold this position for 10 seconds. Repeat 3 times. Complete this stretch 2 times per day.  STRETCH  Soleus, Standing Place hands on wall. Extend right / left leg, keeping the other knee somewhat bent. Slightly point your toes inward on your back foot. Keep your right / left heel on the floor, bend your back knee, and slightly shift your weight over the back leg so that you feel a gentle stretch deep in your back calf. Hold this position for 10 seconds. Repeat 3 times. Complete this stretch 2 times per day.  STRETCH  Gastrocsoleus, Standing  Note: This exercise can place a lot of stress on your foot and ankle. Please complete this exercise only if specifically instructed by your caregiver.  Place the ball of your right / left foot on a step, keeping your other foot firmly on the same step. Hold on to the wall or a rail for balance. Slowly lift your other foot, allowing your body weight to press your heel down over the edge of the step. You should feel a stretch in your right / left calf. Hold this position for 10 seconds. Repeat this exercise with a slight bend in your right / left knee. Repeat 3 times. Complete this stretch 2 times per day.   STRENGTHENING EXERCISES - Plantar Fasciitis (Heel Spur Syndrome)  These exercises may help you when beginning to rehabilitate your injury. They may resolve your symptoms with or without further involvement from your physician, physical therapist or athletic trainer. While completing these exercises, remember:  Muscles can gain both the endurance and the strength needed for everyday activities through controlled exercises. Complete these exercises as instructed by your physician, physical therapist or athletic trainer. Progress the resistance and repetitions only as guided.  STRENGTH - Towel Curls Sit in a chair positioned on a  non-carpeted surface. Place your foot on a towel, keeping your heel on the floor. Pull the towel toward your heel by only curling your toes. Keep your heel on the floor. Repeat 3 times. Complete this exercise 2 times per day.  STRENGTH - Ankle Inversion Secure one end of a rubber exercise band/tubing to a fixed object (table, pole). Loop the other end around your foot just before your toes. Place your fists between your knees. This will focus your strengthening at your ankle. Slowly, pull your big toe up and in, making sure the band/tubing is positioned to resist the entire motion. Hold this position for 10 seconds. Have your muscles resist the band/tubing as it slowly pulls your foot back to the starting position. Repeat 3 times. Complete this exercises 2 times per day.  Document Released: 10/20/2005 Document Revised: 01/12/2012 Document Reviewed: 02/01/2009 [facility] Patient Information 2014 [place], [place].

## 2022-09-01 NOTE — Progress Notes (Signed)
  Subjective:  Patient ID: Ryan Erickson, male    DOB: 24-Dec-1960,  MRN: [id number]  Chief Complaint  Patient presents with   Callouses    Bilateral painful callus lesions, 4 on the right and 1 on the left - very painful at times - patient has vitamin D deficiency     61 y.o. male presents with the above complaint. History confirmed with patient.  He has multiple complaints, most difficult are the painful calluses he has on both feet that are more the left than the right.  He also has arch pain in the left and arch pain that extends into the plantar heel on the right.  Does not recall any injury has not had treatment for it yet.  Objective:  Physical Exam: warm, good capillary refill, no trophic changes or ulcerative lesions, normal DP and PT pulses, normal sensory exam, and multiple porokeratosis on left and right foot painful to pressure. Left Foot: point tenderness of the mid plantar fascia Right Foot: point tenderness over the heel pad and point tenderness of the mid plantar fascia   Assessment:   1. Pes planus of both feet   2. Plantar fasciitis of left foot   3. Plantar fasciitis of right foot   4. Callus of foot      Plan:  Patient was evaluated and treated and all questions answered.  Discussed the etiology and treatment options for plantar fasciitis including stretching, formal physical therapy, supportive shoegears such as a running shoe or sneaker, pre fabricated orthoses, injection therapy, and oral medications. We also discussed the role of surgical treatment of this for patients who do not improve after exhausting non-surgical treatment options.   -Educated patient on stretching and icing of the affected limb -Injection delivered to the plantar fascia of both feet. -Rx for meloxicam. Educated on use, risks and benefits of the medication -Also discussed wearing supportive shoes and inserts such as power steps.  He will consider getting these.  After sterile prep with  povidone-iodine solution and alcohol, the bilateral heel was injected with 0.5cc 2% xylocaine plain, 0.5cc 0.5% marcaine plain, 5mg  triamcinolone acetonide, and 2mg  dexamethasone was injected along the medial plantar fascia at the insertion on the plantar calcaneus. The patient tolerated the procedure well without complication.    Discussed etiology and treatment options for the painful porokeratosis.  We discussed debridement with routine regular pedicures and application of urea cream with salicylic acid which he will get OTC.  He will return for this as needed.  Return if symptoms worsen or fail to improve.

## 2022-12-17 ENCOUNTER — Ambulatory Visit: Payer: Medicare Other | Admitting: Podiatry

## 2023-08-07 ENCOUNTER — Encounter (HOSPITAL_COMMUNITY): Payer: Self-pay

## 2023-08-07 ENCOUNTER — Ambulatory Visit (HOSPITAL_COMMUNITY)
Admission: RE | Admit: 2023-08-07 | Discharge: 2023-08-07 | Disposition: A | Discharge status: Home / Self Care | Payer: Medicare HMO | Source: Ambulatory Visit | Attending: Physician Assistant | Admitting: Physician Assistant

## 2023-08-07 VITALS — BP 139/85 | HR 64 | Temp 98.2°F | Resp 16

## 2023-08-07 DIAGNOSIS — S83411A Sprain of medial collateral ligament of right knee, initial encounter: Secondary | ICD-10-CM

## 2023-08-07 DIAGNOSIS — M2142 Flat foot [pes planus] (acquired), left foot: Secondary | ICD-10-CM | POA: Diagnosis not present

## 2023-08-07 DIAGNOSIS — M2141 Flat foot [pes planus] (acquired), right foot: Secondary | ICD-10-CM | POA: Diagnosis not present

## 2023-08-07 DIAGNOSIS — M79671 Pain in right foot: Secondary | ICD-10-CM

## 2023-08-07 DIAGNOSIS — M79672 Pain in left foot: Secondary | ICD-10-CM

## 2023-08-07 MED ORDER — MELOXICAM 15 MG PO TABS: 15.0000 mg | ORAL_TABLET | Freq: Every day | ORAL | 0 refills | Status: AC

## 2023-08-07 NOTE — Discharge Instructions (Addendum)
You have very flat feet I think this is contributing to your pain.  I recommend you obtain orthopedic/supportive insoles.  Take meloxicam for pain and inflammation.  Do not take NSAIDs with this medication including aspirin, ibuprofen/Advil, naproxen/Aleve.  You can use acetaminophen/Tylenol as needed.  Follow-up with podiatry if your symptoms are not improving; call to schedule an appointment.  I believe that you have sprained one of the ligaments in your knee.  Meloxicam should help with the pain.  Do not take NSAIDs with this medication.  If your symptoms are not improving quickly please follow-up with sports medicine.  Use the brace for comfort and support.  If anything worsens or changes please return for reevaluation.

## 2023-08-07 NOTE — ED Triage Notes (Signed)
Pt  c/o of intermittent bilateral foot pain in arches that he has had for years. He also has right knee pain that started hurting yesterday and he is unable to bend knee fully

## 2023-08-07 NOTE — ED Provider Notes (Signed)
[facility]    CSN: [id number] Arrival date & time: 08/07/23  1204      History   Chief Complaint Chief Complaint  Patient presents with   Foot Injury    HPI Ryan Erickson is a 62 y.o. male.   Patient presents today with several concerns.  His primary concern today is due to history of right knee pain.  He denies any known injury or increase in activity prior to symptom onset.  Does report that he was sleeping in a hospital room when he woke up and his knee was hurting.  Denies any recent fall or trauma.  Pain is rated 8 on a 0-10 pain scale, described as sharp, worse with flexion of the knee, no alleviating factors identified.  Denies any previous injury or surgery.  He has not tried any over-the-counter medication.  Denies any popping, clicking, instability.  He is able to ambulate unassisted and ascend/descend stairs without difficulty.  Denies any history of gout or arthritis.  In addition, he reports a 2+ year long history of bilateral foot pain.  He reports pain is localized to his medial arch worse with prolonged ambulation.  If he rests this improves.  He has seen a podiatrist in the past and was given injection into plantar fascia without improvement of symptoms.  He has not been taking any medication for symptom management.  He has not tried insoles.  He has no pain at rest but this increases with ambulation.    Past Medical History:  Diagnosis Date   Chronic back pain     Patient Active Problem List   Diagnosis Date Noted   Adjustment disorder with depressed mood 08/27/2022   Cannabis dependence (HCC) 08/27/2022   Cellulitis and abscess of oral soft tissues 08/27/2022   Corns and callosities 08/27/2022   Erectile dysfunction 08/27/2022   Hidradenitis 08/27/2022   Major depressive disorder, single episode, moderate (HCC) 08/27/2022   Cellulitis and abscess 08/27/2022   Other ill-defined and unknown causes of morbidity and mortality 08/27/2022   Chronic  post-traumatic stress disorder 08/27/2022   Vitamin D deficiency 03/26/2020   History of colonic polyps 11/03/2008    Past Surgical History:  Procedure Laterality Date   HERNIA REPAIR         Home Medications    Prior to Admission medications   Medication Sig Start Date End Date Taking? Authorizing Provider  meloxicam (MOBIC) 15 MG tablet Take 1 tablet (15 mg total) by mouth daily. 08/07/23   Kenzlei Runions, Noberto Retort, PA-C    Family History History reviewed. No pertinent family history.  Social History Social History   Tobacco Use   Smoking status: Never   Smokeless tobacco: Never  Vaping Use   Vaping status: Never Used  Substance Use Topics   Alcohol use: Never   Drug use: Yes    Types: Marijuana    Comment: occasionally     Allergies   Patient has no known allergies.   Review of Systems Review of Systems  Constitutional:  Positive for activity change. Negative for appetite change, fatigue and fever.  Musculoskeletal:  Positive for arthralgias and joint swelling. Negative for gait problem and myalgias.  Skin:  Negative for color change and wound.  Neurological:  Negative for weakness and numbness.     Physical Exam Triage Vital Signs ED Triage Vitals  Encounter Vitals Group     BP 08/07/23 1227 139/85     Systolic BP Percentile --  Diastolic BP Percentile --      Pulse Rate 08/07/23 1227 64     Resp 08/07/23 1227 16     Temp 08/07/23 1227 98.2 F (36.8 C)     Temp Source 08/07/23 1227 Oral     SpO2 08/07/23 1227 94 %     Weight --      Height --      Head Circumference --      Peak Flow --      Pain Score 08/07/23 1225 10     Pain Loc --      Pain Education --      Exclude from Growth Chart --    No data found.  Updated Vital Signs BP 139/85 (BP Location: Left Arm)   Pulse 64   Temp 98.2 F (36.8 C) (Oral)   Resp 16   SpO2 94%   Visual Acuity Right Eye Distance:   Left Eye Distance:   Bilateral Distance:    Right Eye Near:   Left Eye  Near:    Bilateral Near:     Physical Exam Vitals reviewed.  Constitutional:      General: He is awake.     Appearance: Normal appearance. He is well-developed. He is not ill-appearing.     Comments: Very pleasant male appears stated age in no acute distress sitting comfortably in exam room  HENT:     Head: Normocephalic and atraumatic.     Mouth/Throat:     Pharynx: No oropharyngeal exudate, posterior oropharyngeal erythema or uvula swelling.  Cardiovascular:     Rate and Rhythm: Normal rate and regular rhythm.     Heart sounds: Normal heart sounds, S1 normal and S2 normal. No murmur heard. Pulmonary:     Effort: Pulmonary effort is normal.     Breath sounds: Normal breath sounds. No stridor. No wheezing, rhonchi or rales.     Comments: Clear to auscultation bilaterally Musculoskeletal:     Right knee: No swelling or effusion. Tenderness present over the medial joint line. No LCL laxity, MCL laxity, ACL laxity or PCL laxity.     Right foot: Normal range of motion. No deformity or bunion.     Left foot: Normal range of motion. No deformity or bunion.     Comments: Right knee: No ligamentous laxity on exam.  Tender to palpation over medial joint line.  Normal gait.  Decreased range of motion with flexion but normal extension.  Feet:     Right foot:     Protective Sensation: 10 sites tested.  10 sites sensed.     Skin integrity: Dry skin present. No ulcer, blister or skin breakdown.     Toenail Condition: Right toenails are normal.     Comments: Right foot: Tender to palpation over medial arch with decreased prominence of medial arch.  No tenderness over calcaneus or focal bony tenderness.  Foot is neurovascularly intact. Neurological:     Mental Status: He is alert.  Psychiatric:        Behavior: Behavior is cooperative.      UC Treatments / Results  Labs (all labs ordered are listed, but only abnormal results are displayed) Labs Reviewed - No data to  display  EKG   Radiology No results found.  Procedures Procedures (including critical care time)  Medications Ordered in UC Medications - No data to display  Initial Impression / Assessment and Plan / UC Course  I have reviewed the triage vital signs and the nursing  notes.  Pertinent labs & imaging results that were available during my care of the patient were reviewed by me and considered in my medical decision making (see chart for details).     Patient is well-appearing, afebrile, nontoxic, nontachycardic.  X-ray of knee was deferred as he denies any recent trauma and has no focal bony tenderness.  I suspect sprain of LCL as etiology of symptoms given his clinical presentation.  He was placed in a brace for comfort and support.  Recommended that he use RICE protocol for symptom relief.  He will start Mobic daily.  Discussed that he still to take NSAIDs with this medication due to risk of GI bleeding.  He can use acetaminophen for additional pain relief.  If his symptoms are not improving he is to follow-up with sports medicine was given contact information for local provider.  Strict return precautions given.  Patient declined excuse note.  Suspect symptoms are related to pes planus.  He was encouraged to use supportive insoles and encouraged him to apply these over-the-counter but then follow-up with podiatry to determine if specialized insoles may provide additional relief.  We discussed that Mobic should also help with pain.  If he has any worsening or changing symptoms he is to return for reevaluation.  Final Clinical Impressions(s) / UC Diagnoses   Final diagnoses:  Pes planus of both feet  Pain in both feet  Sprain of medial collateral ligament of right knee, initial encounter     Discharge Instructions      You have very flat feet I think this is contributing to your pain.  I recommend you obtain orthopedic/supportive insoles.  Take meloxicam for pain and inflammation.   Do not take NSAIDs with this medication including aspirin, ibuprofen/Advil, naproxen/Aleve.  You can use acetaminophen/Tylenol as needed.  Follow-up with podiatry if your symptoms are not improving; call to schedule an appointment.  I believe that you have sprained one of the ligaments in your knee.  Meloxicam should help with the pain.  Do not take NSAIDs with this medication.  If your symptoms are not improving quickly please follow-up with sports medicine.  Use the brace for comfort and support.  If anything worsens or changes please return for reevaluation.     ED Prescriptions     Medication Sig Dispense Auth. Provider   meloxicam (MOBIC) 15 MG tablet Take 1 tablet (15 mg total) by mouth daily. 30 tablet Ramandeep Arington, Noberto Retort, PA-C      PDMP not reviewed this encounter.   Jeani Hawking, PA-C 08/07/23 1301

## 2023-08-17 ENCOUNTER — Ambulatory Visit: Payer: Medicare HMO | Admitting: Family Medicine

## 2023-08-26 ENCOUNTER — Ambulatory Visit (INDEPENDENT_AMBULATORY_CARE_PROVIDER_SITE_OTHER): Payer: Medicare HMO | Admitting: Podiatry

## 2023-08-26 ENCOUNTER — Encounter: Payer: Self-pay | Admitting: Podiatry

## 2023-08-26 VITALS — BP 122/78 | HR 71

## 2023-08-26 DIAGNOSIS — M722 Plantar fascial fibromatosis: Secondary | ICD-10-CM | POA: Diagnosis not present

## 2023-08-26 NOTE — Patient Instructions (Signed)

## 2023-08-27 NOTE — Progress Notes (Signed)
  Subjective:  Patient ID: Ryan Erickson, male    DOB: 12/31/1960,  MRN: [id number]  Chief Complaint  Patient presents with   Foot Pain    "The bottom of my feet hurt, especially the left one."    62 y.o. male presents with the above complaint. History confirmed with patient.  Still having arch pain right foot is not as bad  Objective:  Physical Exam: warm, good capillary refill, no trophic changes or ulcerative lesions, normal DP and PT pulses, normal sensory exam, pes planus deformity bilateral Left Foot: point tenderness of the mid plantar fascia Right Foot: Minimal pain on palpation   Assessment:   1. Plantar fasciitis of left foot      Plan:  Patient was evaluated and treated and all questions answered.  Discussed the etiology and treatment options for plantar fasciitis including stretching, formal physical therapy, supportive shoegears such as a running shoe or sneaker, pre fabricated orthoses, injection therapy, and oral medications. We also discussed the role of surgical treatment of this for patients who do not improve after exhausting non-surgical treatment options.   Injection delivered to the plantar fascia the left foot as noted below PT referral made -I again recommended he use a supportive OTC orthosis  After sterile prep with povidone-iodine solution and alcohol, the left heel was injected with 0.5cc 2% xylocaine plain, 0.5cc 0.5% marcaine plain, 5mg  triamcinolone acetonide, and 2mg  dexamethasone was injected along the medial plantar fascia at the insertion on the plantar calcaneus. The patient tolerated the procedure well without complication.    Return in about 2 months (around 10/26/2023) for recheck plantar fasciitis.

## 2023-10-21 ENCOUNTER — Ambulatory Visit: Payer: Medicare HMO | Admitting: Podiatry

## 2023-11-09 ENCOUNTER — Ambulatory Visit (INDEPENDENT_AMBULATORY_CARE_PROVIDER_SITE_OTHER): Payer: Medicare HMO | Admitting: Podiatry

## 2023-11-09 ENCOUNTER — Encounter: Payer: Self-pay | Admitting: Podiatry

## 2023-11-09 DIAGNOSIS — M722 Plantar fascial fibromatosis: Secondary | ICD-10-CM | POA: Diagnosis not present

## 2023-11-09 NOTE — Progress Notes (Signed)
  Subjective:  Patient ID: Ryan Erickson, male    DOB: 1961/07/09,  MRN: [id number]  Chief Complaint  Patient presents with   Plantar Fasciitis    He gave me a shot last time.  It stopped hurting for a while.  Then it started hurting all over again.  The other foot has started to hurt too.   Callouses    I want to see if I can get those calluses cut out.  They hurt.    63 y.o. male presents with the above complaint. History confirmed with patient.  Still having arch pain the injection helped for about 2 weeks and then came back.  Objective:  Physical Exam: warm, good capillary refill, no trophic changes or ulcerative lesions, normal DP and PT pulses, normal sensory exam, pes planus deformity bilateral Left Foot: point tenderness of the mid plantar fascia Right Foot: Minimal pain on palpation   Assessment:   1. Plantar fasciitis of left foot      Plan:  Patient was evaluated and treated and all questions answered.  We again discussed further nonsurgical and surgical treatment options.  He does not want to proceed with further injections.  He would like permanent correction.  I still recommended to use a OTC insole.  Order for MRI was placed.  I will see him back after the study for treatment options.    Return if symptoms worsen or fail to improve, for after MRI to review.

## 2023-11-09 NOTE — Patient Instructions (Signed)
 Call [facility] to schedule your MRI at the below locations.  Please allow at least 1 business day after your visit to process the referral.  It may take longer depending on approval from insurance.  Please let me know if you have issues or problems scheduling the MRI   [facility] [phone] [address] [place] [address] [place], [place] [address]  [facility] [phone] [place], [place] [address]    Look for salicylic acid 40% cream, medicated pads or ointment and apply to the skin lesions. This can be bought over the counter, at a pharmacy or online such as [employer].

## 2024-05-11 ENCOUNTER — Other Ambulatory Visit: Payer: Self-pay

## 2024-05-11 ENCOUNTER — Emergency Department
Admission: EM | Admit: 2024-05-11 | Discharge: 2024-05-11 | Disposition: A | Discharge status: Home / Self Care | Attending: Emergency Medicine | Admitting: Emergency Medicine

## 2024-05-11 DIAGNOSIS — R42 Dizziness and giddiness: Secondary | ICD-10-CM | POA: Insufficient documentation

## 2024-05-11 LAB — COMPREHENSIVE METABOLIC PANEL WITH GFR
ALT: 28 U/L (ref 0–44)
AST: 29 U/L (ref 15–41)
Albumin: 4.2 g/dL (ref 3.5–5.0)
Alkaline Phosphatase: 54 U/L (ref 38–126)
Anion gap: 8 (ref 5–15)
BUN: 15 mg/dL (ref 8–23)
CO2: 27 mmol/L (ref 22–32)
Calcium: 9.8 mg/dL (ref 8.9–10.3)
Chloride: 107 mmol/L (ref 98–111)
Creatinine, Ser: 1.23 mg/dL (ref 0.61–1.24)
GFR, Estimated: >60 mL/min (ref >60)
Glucose, Bld: 107 mg/dL — ABNORMAL HIGH (ref 70–99)
Potassium: 4 mmol/L (ref 3.5–5.1)
Sodium: 142 mmol/L (ref 135–145)
Total Bilirubin: 0.7 mg/dL (ref 0.0–1.2)
Total Protein: 7.2 g/dL (ref 6.5–8.1)

## 2024-05-11 LAB — CBC
HCT: 39.4 % (ref 39.0–52.0)
Hemoglobin: 13.7 g/dL (ref 13.0–17.0)
MCH: 32.6 pg (ref 26.0–34.0)
MCHC: 34.8 g/dL (ref 30.0–36.0)
MCV: 93.8 fL (ref 80.0–100.0)
Platelets: 255 K/uL (ref 150–400)
RBC: 4.2 MIL/uL — ABNORMAL LOW (ref 4.22–5.81)
RDW: 12.1 % (ref 11.5–15.5)
WBC: 7 K/uL (ref 4.0–10.5)
nRBC: 0 % (ref 0.0–0.2)

## 2024-05-11 NOTE — ED Triage Notes (Signed)
 Pt comes in via [facility] with complaints of dizziness. Pt states that he was smoking ginseng tea when he started having the dizziness, and wants to be checked out. PT has no complaints of pain of sob at this time.  Pt unsteady on feet with EMS.

## 2024-05-11 NOTE — ED Provider Notes (Signed)
 [facility] Provider Note    Event Date/Time   First MD Initiated Contact with Patient 05/11/24 1300     (approximate)   History   No chief complaint on file.   HPI  Ryan Erickson is a 63 y.o. male who presents to the ED for evaluation of No chief complaint on file.   Patient presents after resolved episode of dizziness when he awoke this morning.  Reports last night he drank some herbal tea that he bought online after seeing an advertisement on YouTube.  Drank the tea right before bed and awoke with some vertiginous dizziness that resolved by the time I see him and he reports feeling fine just wants to make sure that he is okay.  No other concerns.  No falls, syncope or head trauma  Notably triage note indicates that he was smoking tea leaves but he denies this and reports that he drank liquid tea.   Physical Exam   Triage Vital Signs: ED Triage Vitals  Encounter Vitals Group     BP 05/11/24 1052 (!) 150/78     Girls Systolic BP Percentile --      Girls Diastolic BP Percentile --      Boys Systolic BP Percentile --      Boys Diastolic BP Percentile --      Pulse Rate 05/11/24 1052 71     Resp 05/11/24 1052 18     Temp 05/11/24 1052 98.3 F (36.8 C)     Temp src --      SpO2 05/11/24 1052 96 %     Weight 05/11/24 1056 140 lb (63.5 kg)     Height 05/11/24 1056 5' 8 (1.727 m)     Head Circumference --      Peak Flow --      Pain Score 05/11/24 1056 0     Pain Loc --      Pain Education --      Exclude from Growth Chart --     Most recent vital signs: Vitals:   05/11/24 1052 05/11/24 1356  BP: (!) 150/78 (!) 151/75  Pulse: 71 70  Resp: 18 16  Temp: 98.3 F (36.8 C) 98.2 F (36.8 C)  SpO2: 96%     General: Awake, no distress.  CV:  Good peripheral perfusion.  Resp:  Normal effort.  Abd:  No distention.  MSK:  No deformity noted.  Neuro:  No focal deficits appreciated. Cranial nerves II through XII intact 5/5 strength and  sensation in all 4 extremities Other:     ED Results / Procedures / Treatments   Labs (all labs ordered are listed, but only abnormal results are displayed) Labs Reviewed  COMPREHENSIVE METABOLIC PANEL WITH GFR - Abnormal; Notable for the following components:      Result Value   Glucose, Bld 107 (*)    All other components within normal limits  CBC - Abnormal; Notable for the following components:   RBC 4.20 (*)    All other components within normal limits    EKG Sinus rhythm with a rate of 69 bpm.  Normal axis and intervals without clear signs of acute ischemia.  RADIOLOGY   Official radiology report(s): No results found.  PROCEDURES and INTERVENTIONS:  Procedures  Medications - No data to display   IMPRESSION / MDM / ASSESSMENT AND PLAN / ED COURSE  I reviewed the triage vital signs and the nursing notes.  Differential diagnosis includes, but is not limited  to, medication side effect, BPPV, stroke, dehydration, symptomatic anemia, cardiac dysrhythmia  {Patient presents with symptoms of an acute illness or injury that is potentially life-threatening.  Patient presents after an episode of dizziness with a benign workup and suitable for outpatient management.  Asymptomatic and with a normal exam.  Normal vitals, CBC, metabolic panel.  No evidence of cardiac dysrhythmia.  Suitable for outpatient management.  Discussed return precautions.  Discussed hesitation with non-FDA approved supplements      FINAL CLINICAL IMPRESSION(S) / ED DIAGNOSES   Final diagnoses:  Dizziness     Rx / DC Orders   ED Discharge Orders     None        Note:  This document was prepared using Dragon voice recognition software and may include unintentional dictation errors.   Claudene Rover, MD 05/11/24 [phone]

## 2024-12-07 ENCOUNTER — Ambulatory Visit: Admitting: Podiatry
